# Patient Record
Sex: Male | Born: 1995 | Marital: Single | Smoking: Never smoker
Health system: Southern US, Community
[De-identification: ages and names within clinical notes are randomized; demographics above are authoritative.]

## PROBLEM LIST (undated history)

## (undated) HISTORY — PX: WISDOM TOOTH EXTRACTION: SHX21

---
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
## 2015-11-04 ENCOUNTER — Emergency Department (HOSPITAL_COMMUNITY): Payer: BLUE CROSS/BLUE SHIELD

## 2015-11-04 ENCOUNTER — Encounter (HOSPITAL_COMMUNITY): Payer: Self-pay

## 2015-11-04 ENCOUNTER — Emergency Department (HOSPITAL_COMMUNITY)
Admission: EM | Admit: 2015-11-04 | Discharge: 2015-11-04 | Disposition: A | Discharge status: Home / Self Care | Payer: BLUE CROSS/BLUE SHIELD | Attending: Emergency Medicine | Admitting: Emergency Medicine

## 2015-11-04 DIAGNOSIS — Y92410 Unspecified street and highway as the place of occurrence of the external cause: Secondary | ICD-10-CM | POA: Insufficient documentation

## 2015-11-04 DIAGNOSIS — IMO0002 Reserved for concepts with insufficient information to code with codable children: Secondary | ICD-10-CM

## 2015-11-04 DIAGNOSIS — S060X1A Concussion with loss of consciousness of 30 minutes or less, initial encounter: Secondary | ICD-10-CM | POA: Diagnosis not present

## 2015-11-04 DIAGNOSIS — S0990XA Unspecified injury of head, initial encounter: Secondary | ICD-10-CM | POA: Diagnosis present

## 2015-11-04 DIAGNOSIS — S0181XA Laceration without foreign body of other part of head, initial encounter: Secondary | ICD-10-CM | POA: Diagnosis not present

## 2015-11-04 DIAGNOSIS — Y998 Other external cause status: Secondary | ICD-10-CM | POA: Diagnosis not present

## 2015-11-04 DIAGNOSIS — Y9389 Activity, other specified: Secondary | ICD-10-CM | POA: Diagnosis not present

## 2015-11-04 DIAGNOSIS — Z23 Encounter for immunization: Secondary | ICD-10-CM | POA: Insufficient documentation

## 2015-11-04 MED ORDER — TETANUS-DIPHTH-ACELL PERTUSSIS 5-2.5-18.5 LF-MCG/0.5 IM SUSP
0.5000 mL | Freq: Once | INTRAMUSCULAR | Status: AC
  Administered 2015-11-04: 0.5 mL via INTRAMUSCULAR
  Filled 2015-11-04: qty 0.5

## 2015-11-04 MED ORDER — LIDOCAINE-EPINEPHRINE 1 %-1:100000 IJ SOLN
20.0000 mL | Freq: Once | INTRAMUSCULAR | Status: AC
  Administered 2015-11-04: 20 mL
  Filled 2015-11-04: qty 1

## 2015-11-04 MED ORDER — OXYCODONE-ACETAMINOPHEN 5-325 MG PO TABS
1.0000 | ORAL_TABLET | Freq: Once | ORAL | Status: AC
  Administered 2015-11-04: 1 via ORAL
  Filled 2015-11-04: qty 1

## 2015-11-04 NOTE — Discharge Instructions (Signed)
Have your sutures and staples removed in 7 days.  Refer to the wound care instructions provided.  Return to the emergency department should you have concerns for infection of the wound.  Return to the emergency department should you have worsening of symptoms of a concussion. See information provided regarding concussion.
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
## 2015-11-04 NOTE — ED Notes (Signed)
Pt. Transported to xray at this time.  

## 2017-10-10 DIAGNOSIS — J209 Acute bronchitis, unspecified: Secondary | ICD-10-CM | POA: Diagnosis not present

## 2017-10-10 DIAGNOSIS — J029 Acute pharyngitis, unspecified: Secondary | ICD-10-CM | POA: Diagnosis not present

## 2018-03-11 DIAGNOSIS — Z0000 Encounter for general adult medical examination without abnormal findings: Secondary | ICD-10-CM | POA: Diagnosis not present

## 2018-04-22 DIAGNOSIS — Z23 Encounter for immunization: Secondary | ICD-10-CM | POA: Diagnosis not present

## 2019-01-10 DIAGNOSIS — H60333 Swimmer's ear, bilateral: Secondary | ICD-10-CM | POA: Diagnosis not present

## 2019-01-10 DIAGNOSIS — Z113 Encounter for screening for infections with a predominantly sexual mode of transmission: Secondary | ICD-10-CM | POA: Diagnosis not present

## 2019-01-10 DIAGNOSIS — H6123 Impacted cerumen, bilateral: Secondary | ICD-10-CM | POA: Diagnosis not present

## 2019-01-10 DIAGNOSIS — R399 Unspecified symptoms and signs involving the genitourinary system: Secondary | ICD-10-CM | POA: Diagnosis not present

## 2019-01-11 DIAGNOSIS — Z20828 Contact with and (suspected) exposure to other viral communicable diseases: Secondary | ICD-10-CM | POA: Diagnosis not present

## 2019-05-30 DIAGNOSIS — L02416 Cutaneous abscess of left lower limb: Secondary | ICD-10-CM | POA: Diagnosis not present

## 2020-03-23 ENCOUNTER — Ambulatory Visit (INDEPENDENT_AMBULATORY_CARE_PROVIDER_SITE_OTHER): Payer: 59

## 2020-03-23 ENCOUNTER — Other Ambulatory Visit: Payer: Self-pay

## 2020-03-23 ENCOUNTER — Ambulatory Visit (HOSPITAL_COMMUNITY)
Admission: EM | Admit: 2020-03-23 | Discharge: 2020-03-23 | Disposition: A | Discharge status: Home / Self Care | Payer: 59 | Attending: Emergency Medicine | Admitting: Emergency Medicine

## 2020-03-23 ENCOUNTER — Encounter (HOSPITAL_COMMUNITY): Payer: Self-pay | Admitting: *Deleted

## 2020-03-23 DIAGNOSIS — M25552 Pain in left hip: Secondary | ICD-10-CM

## 2020-03-23 MED ORDER — IBUPROFEN 800 MG PO TABS: 800.0000 mg | ORAL_TABLET | Freq: Three times a day (TID) | ORAL | 0 refills | Status: AC

## 2020-03-23 NOTE — ED Triage Notes (Signed)
Pt reports he was running around and hit his Lt hip on a wooden booth.

## 2020-03-23 NOTE — Discharge Instructions (Signed)
Xray normal Use anti-inflammatories for pain/swelling. You may take up to 800 mg Ibuprofen every 8 hours with food. You may supplement Ibuprofen with Tylenol [phone] mg every 8 hours.  Ice area Follow up with sports medicine if not improving

## 2020-03-24 NOTE — ED Provider Notes (Signed)
[facility]    CSN: [id number] Arrival date & time: 03/23/20  1612      History   Chief Complaint Chief Complaint  Patient presents with  . Hip Pain    LT    HPI Dustin Roth is a 24 y.o. male presenting today for evaluation of hip pain.  Reports that proximately 4 days he was running, slid and landed on a wooden booth, when he stood up he felt a sensation in his left hip with pain.  Since he has continued pain in this area.  He denies any numbness or tingling.  Denies any urination problems.  Denies any back pain.  HPI  History reviewed. No pertinent past medical history.  There are no problems to display for this patient.   Past Surgical History:  Procedure Laterality Date  . WISDOM TOOTH EXTRACTION         Home Medications    Prior to Admission medications   Medication Sig Start Date End Date Taking? Authorizing Provider  ibuprofen (ADVIL) 800 MG tablet Take 1 tablet (800 mg total) by mouth 3 (three) times daily. 03/23/20   Amahri Dengel, Junius Creamer, PA-C    Family History History reviewed. No pertinent family history.  Social History Social History   Tobacco Use  . Smoking status: Never Smoker  . Smokeless tobacco: Never Used  Substance Use Topics  . Alcohol use: No  . Drug use: Never     Allergies   Patient has no known allergies.   Review of Systems Review of Systems  Constitutional: Negative for fatigue and fever.  Eyes: Negative for redness, itching and visual disturbance.  Respiratory: Negative for shortness of breath.   Cardiovascular: Negative for chest pain and leg swelling.  Gastrointestinal: Negative for nausea and vomiting.  Musculoskeletal: Positive for arthralgias, gait problem and myalgias.  Skin: Negative for color change, rash and wound.  Neurological: Negative for dizziness, syncope, weakness, light-headedness and headaches.     Physical Exam Triage Vital Signs ED Triage Vitals  Enc Vitals Group     BP 03/23/20 1643  (!) 94/39     Pulse Rate 03/23/20 1643 77     Resp 03/23/20 1643 15     Temp 03/23/20 1643 98.2 F (36.8 C)     Temp Source 03/23/20 1643 Oral     SpO2 03/23/20 1643 96 %     Weight 03/23/20 1645 150 lb (68 kg)     Height 03/23/20 1645 6' (1.829 m)     Head Circumference --      Peak Flow --      Pain Score 03/23/20 1642 7     Pain Loc --      Pain Edu? --      Excl. in GC? --    No data found.  Updated Vital Signs BP (!) 94/39 (BP Location: Right Arm)   Pulse 77   Temp 98.2 F (36.8 C) (Oral)   Resp 15   Ht 6' (1.829 m)   Wt 150 lb (68 kg)   SpO2 96%   BMI 20.34 kg/m   Visual Acuity Right Eye Distance:   Left Eye Distance:   Bilateral Distance:    Right Eye Near:   Left Eye Near:    Bilateral Near:     Physical Exam Vitals and nursing note reviewed.  Constitutional:      Appearance: He is well-developed.     Comments: No acute distress  HENT:     Head:  Normocephalic and atraumatic.     Nose: Nose normal.  Eyes:     Conjunctiva/sclera: Conjunctivae normal.  Cardiovascular:     Rate and Rhythm: Normal rate.  Pulmonary:     Effort: Pulmonary effort is normal. No respiratory distress.  Abdominal:     General: There is no distension.  Musculoskeletal:        General: Normal range of motion.     Cervical back: Neck supple.     Comments: Gait with minimal antalgia favoring left side Tenderness to palpation to lateral proximal thigh, nontender along ASIS/ala of the pelvis; no tenderness into groin area or anterior proximal left leg  Full active range of motion of hip in all directions  Skin:    General: Skin is warm and dry.  Neurological:     Mental Status: He is alert and oriented to person, place, and time.      UC Treatments / Results  Labs (all labs ordered are listed, but only abnormal results are displayed) Labs Reviewed - No data to display  EKG   Radiology DG Hip Unilat With Pelvis 2-3 Views Left  Result Date: 03/23/2020 CLINICAL  DATA:  Left hip pain EXAM: DG HIP (WITH OR WITHOUT PELVIS) 2-3V LEFT COMPARISON:  None. FINDINGS: There is no evidence of hip fracture or dislocation. There is no evidence of arthropathy or other focal bone abnormality. IMPRESSION: Negative. Electronically Signed   By: Charlett Nose M.D.   On: 03/23/2020 17:52    Procedures Procedures (including critical care time)  Medications Ordered in UC Medications - No data to display  Initial Impression / Assessment and Plan / UC Course  I have reviewed the triage vital signs and the nursing notes.  Pertinent labs & imaging results that were available during my care of the patient were reviewed by me and considered in my medical decision making (see chart for details).     X-ray negative for any acute bony abnormality, low suspicion of any tendon injury given full range of motion, recommending treatment strain with anti-inflammatories ice and rest.  Continue to monitor, follow-up with sports medicine if symptoms persisting.  Discussed strict return precautions. Patient verbalized understanding and is agreeable with plan.  Final Clinical Impressions(s) / UC Diagnoses   Final diagnoses:  Acute pain of left hip     Discharge Instructions     Xray normal Use anti-inflammatories for pain/swelling. You may take up to 800 mg Ibuprofen every 8 hours with food. You may supplement Ibuprofen with Tylenol [phone] mg every 8 hours.  Ice area Follow up with sports medicine if not improving   ED Prescriptions    Medication Sig Dispense Auth. Provider   ibuprofen (ADVIL) 800 MG tablet Take 1 tablet (800 mg total) by mouth 3 (three) times daily. 21 tablet Kymberlee Viger, [place] C, PA-C     PDMP not reviewed this encounter.   Krisalyn Yankowski, [place] C, PA-C 03/24/20 1006

## 2021-03-01 IMAGING — DX DG HIP (WITH OR WITHOUT PELVIS) 2-3V*L*
3 series · 3 of 3 positions shown · non-contrast
Comparison: None.

CLINICAL DATA: Left hip pain

EXAM:
DG HIP (WITH OR WITHOUT PELVIS) 2-3V LEFT

[hip lat]
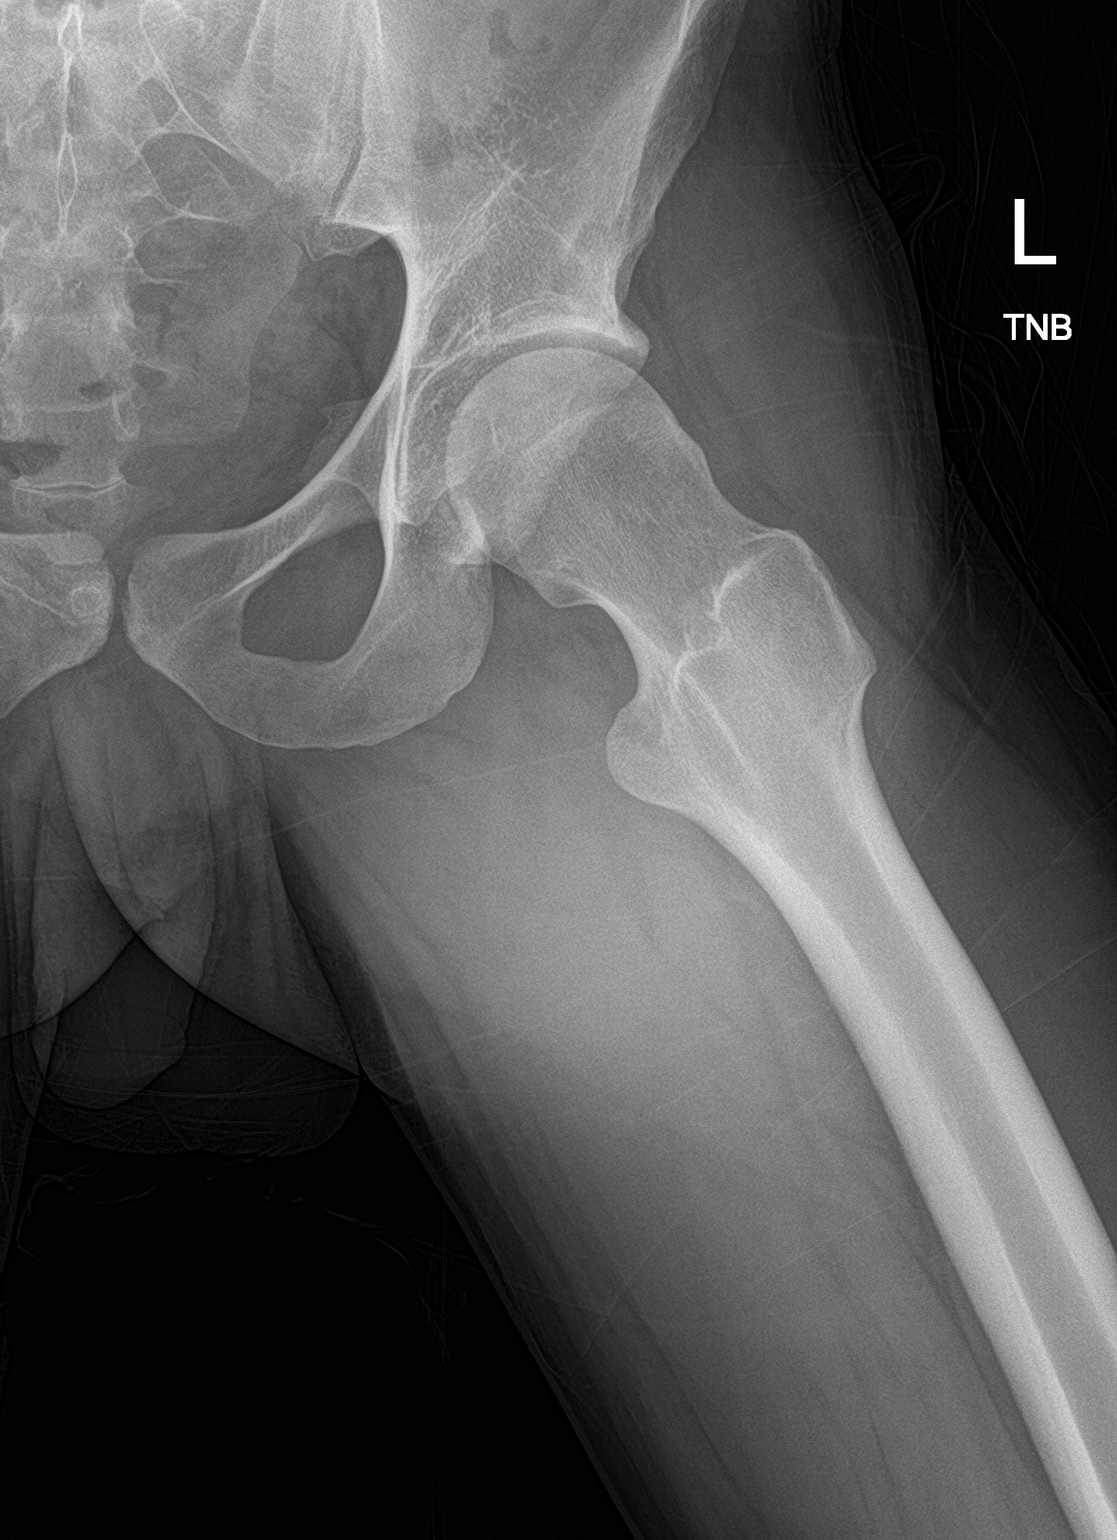

[pelvis ap]
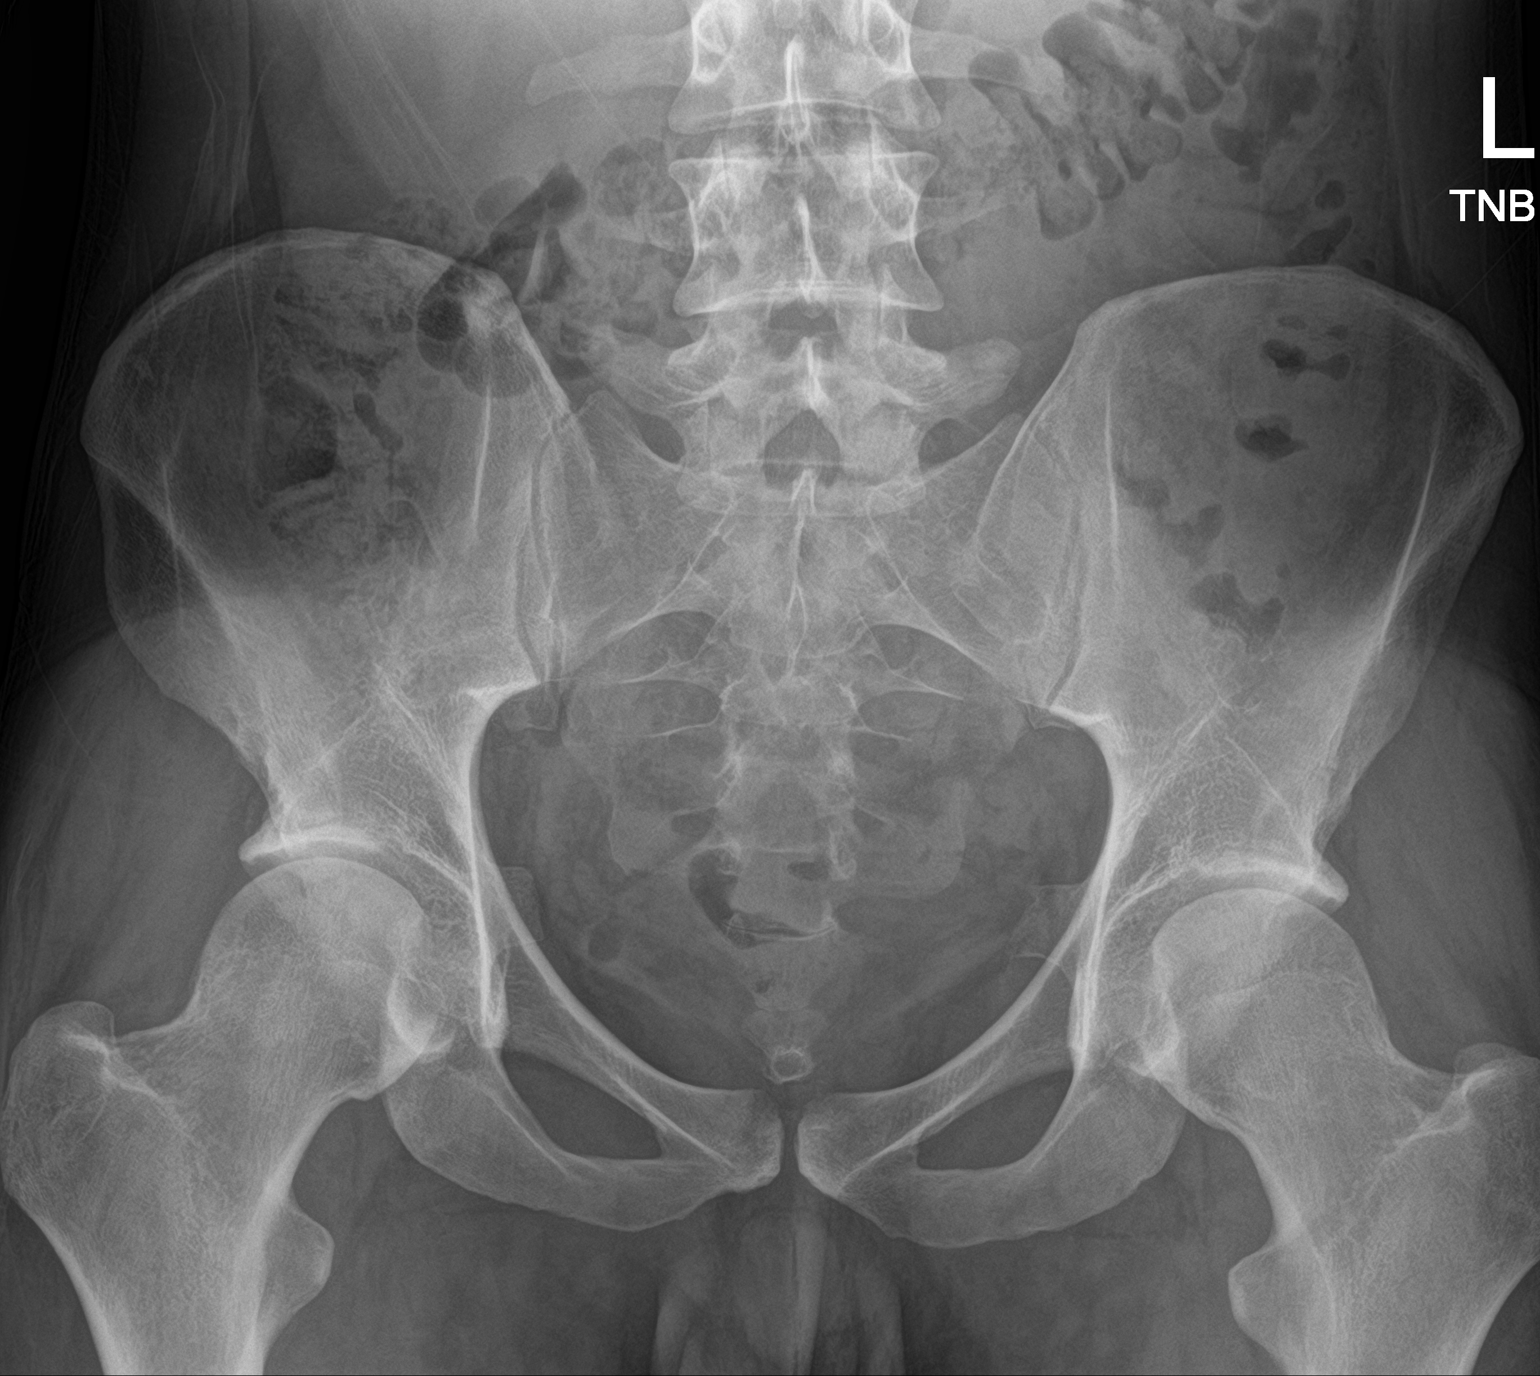

[hip ap]
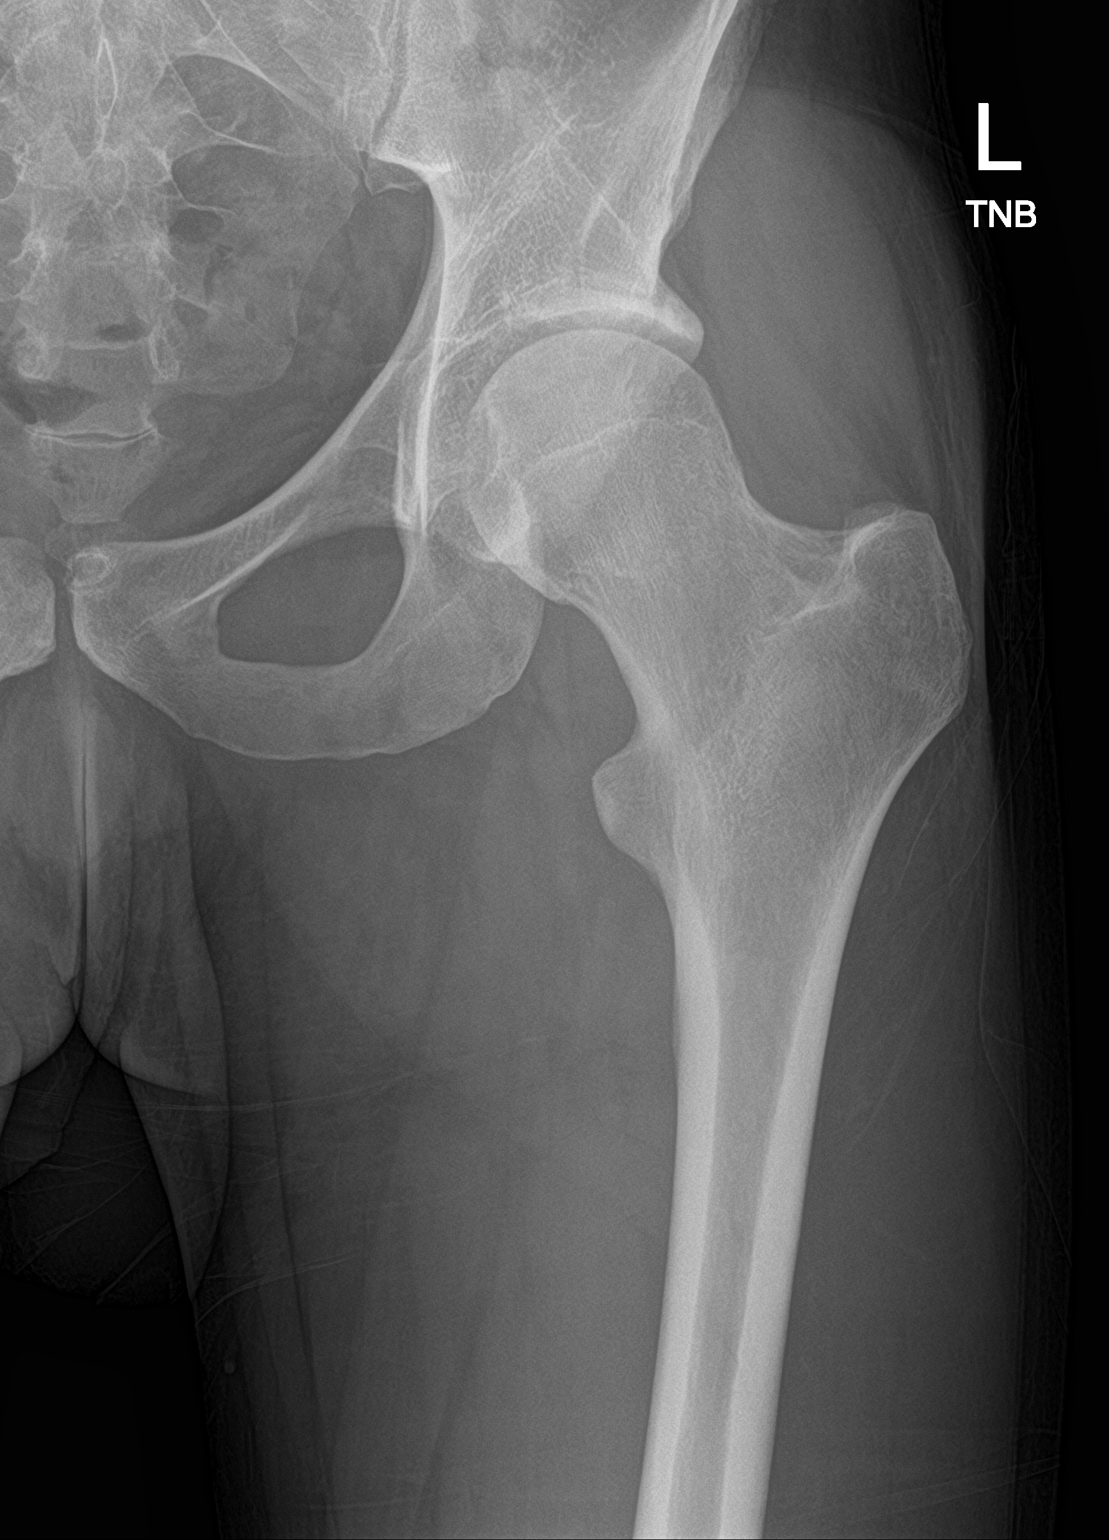

[3 of 3 positions shown; findings below may reference images not displayed]

FINDINGS: There is no evidence of hip fracture or dislocation. There is no
evidence of arthropathy or other focal bone abnormality.
IMPRESSION: Negative.
